# Patient Record
Sex: Male | Born: 2000 | Race: Black or African American | Hispanic: No | Marital: Single | Smoking: Current some day smoker
Health system: Southern US, Community
[De-identification: ages and names within clinical notes are randomized; demographics above are authoritative.]

## PROBLEM LIST (undated history)

## (undated) HISTORY — PX: OTHER SURGICAL HISTORY: SHX169

---
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
## 2019-08-28 IMAGING — CT CT NECK W/ CM
3 of 5 series · 13 of 35 positions shown, 16 images · IV contrast (omnipaque)
Comparison: None.

CLINICAL DATA: Initial evaluation for acute sore throat.

EXAM:
CT NECK WITH CONTRAST
TECHNIQUE: Multidetector CT imaging of the neck was performed using the
standard protocol following the bolus administration of intravenous
contrast.
CONTRAST:  75mL OMNIPAQUE IOHEXOL 300 MG/ML  SOLN

[Series 6: sag neck · sagittal · 0.57mm/px · 5 of 224 slices shown, 6 images]
[im 75/224  bone]
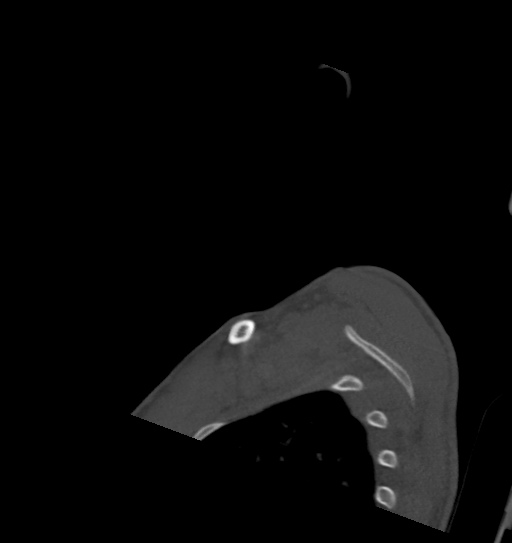
[im 93/224  bone]
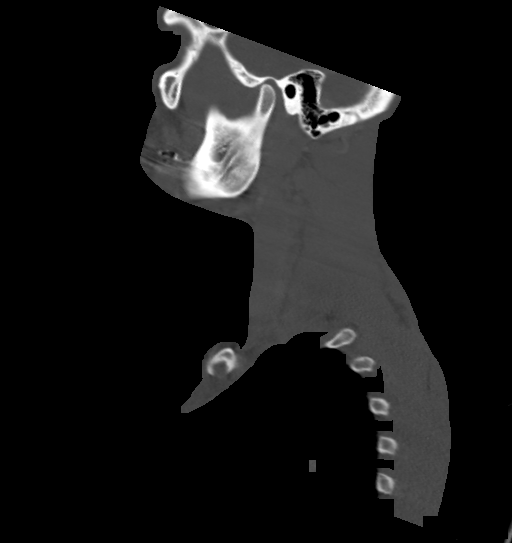
[im 112/224  soft-tissue]
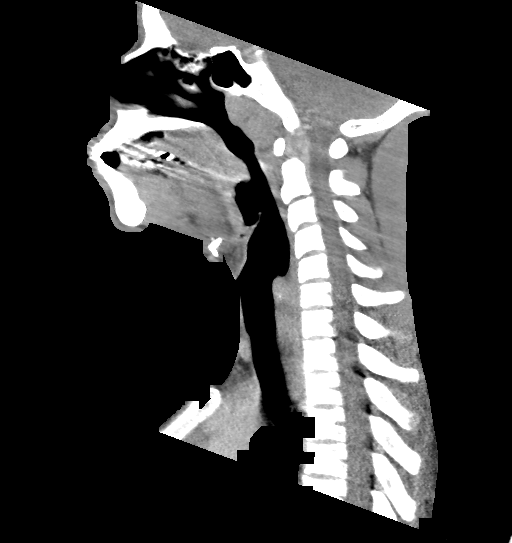
[im 112/224  bone]
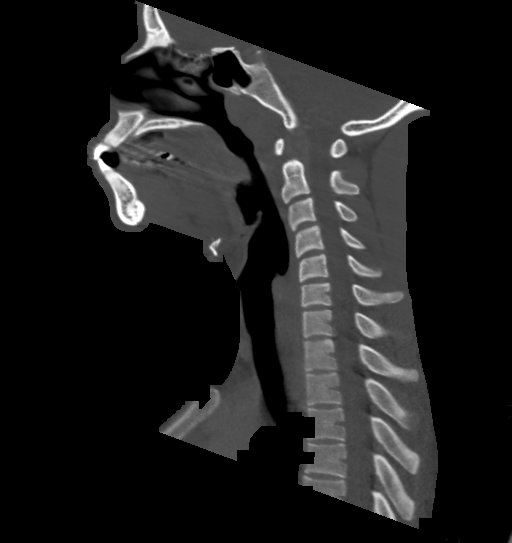
[im 131/224  bone]
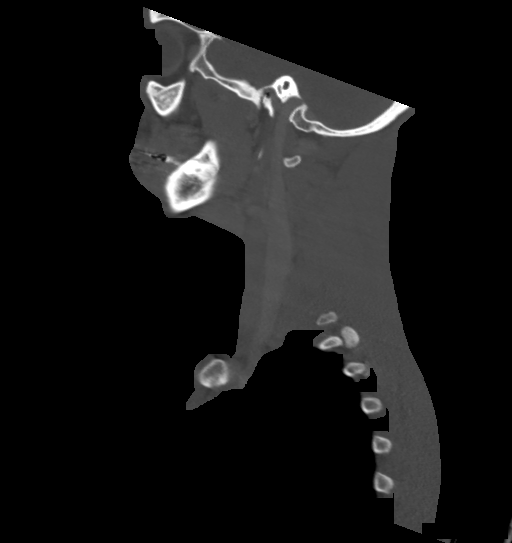
[im 149/224  bone]
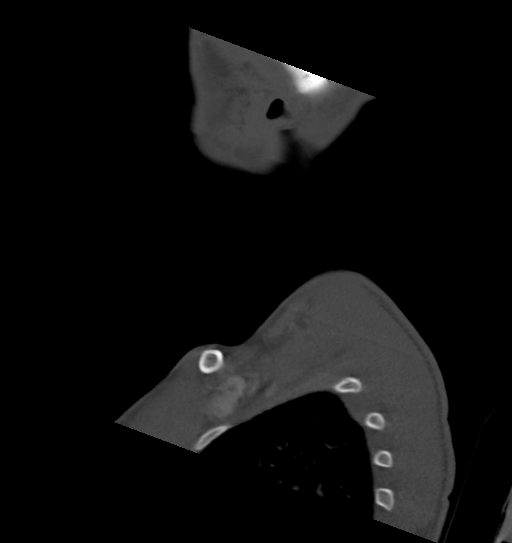

[Series 7: cor neck · coronal · 0.60mm/px · 3 of 118 slices shown]
[im 41/118  bone]
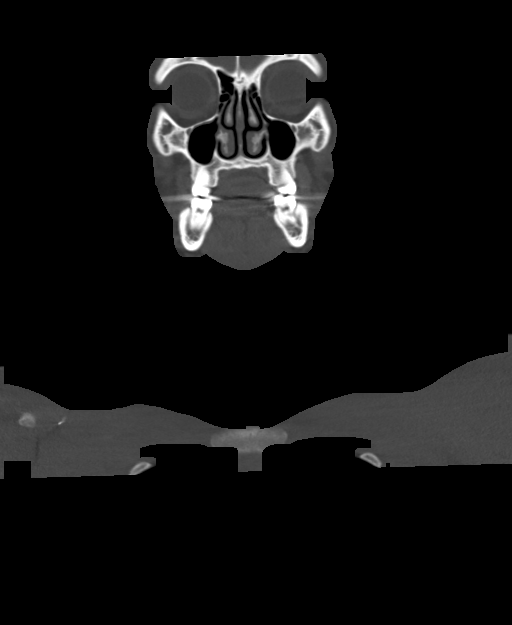
[im 53/118  bone]
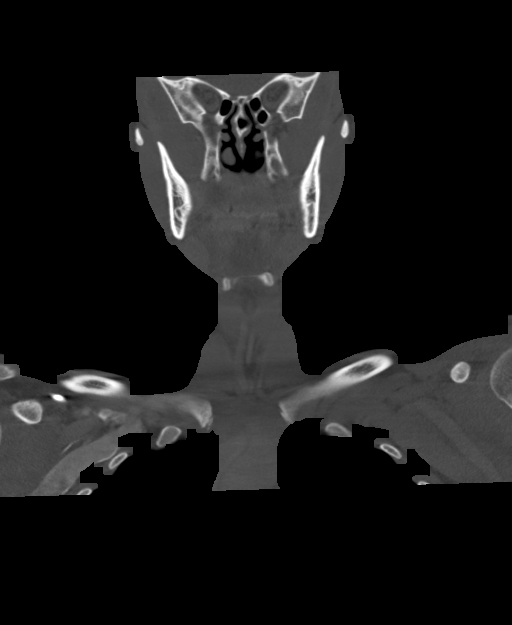
[im 65/118  bone]
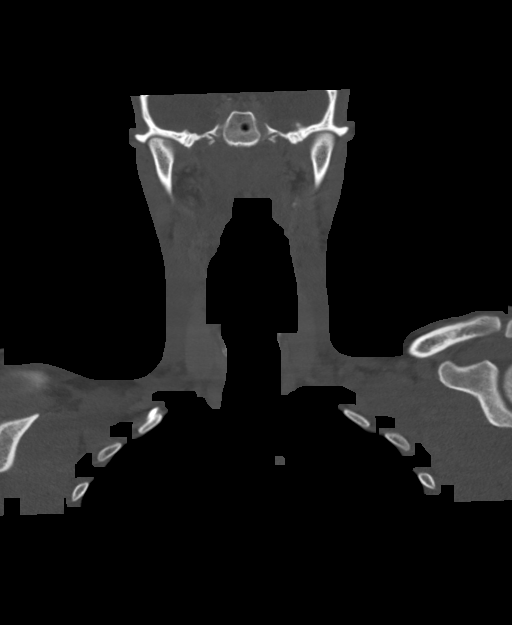

[Series 8: orthogonal ax · axial · 0.51mm/px · z∈[-350,-153]mm · 5 of 162 slices shown, 7 images]
[im 27/162  soft-tissue]
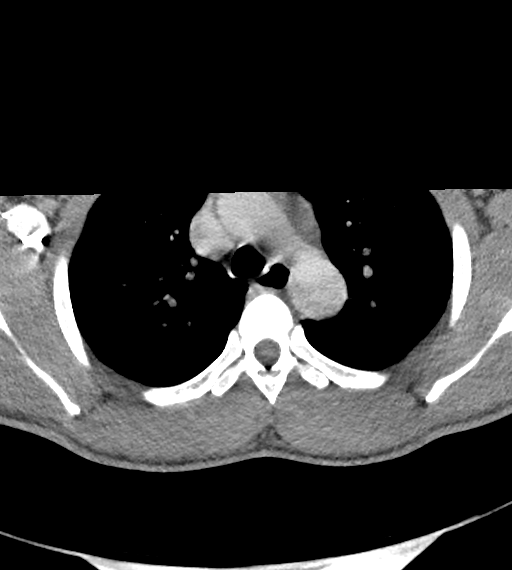
[im 27/162  bone]
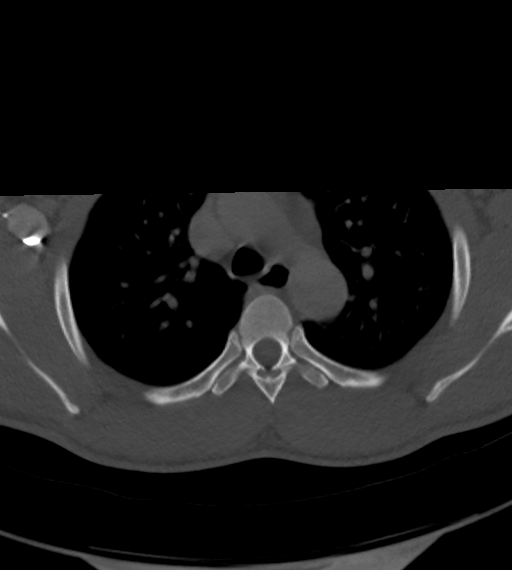
[im 54/162  bone]
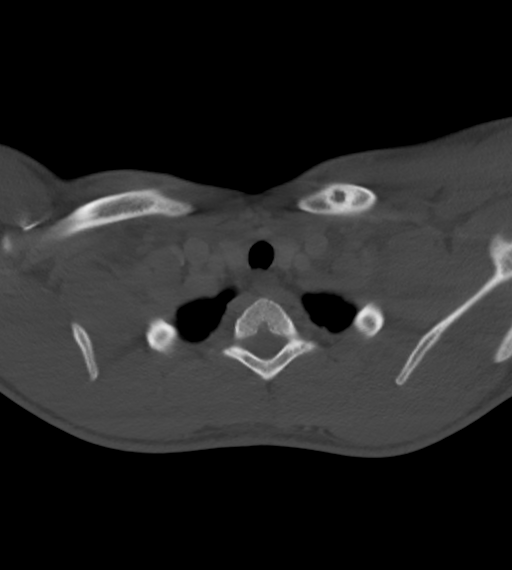
[im 81/162  bone]
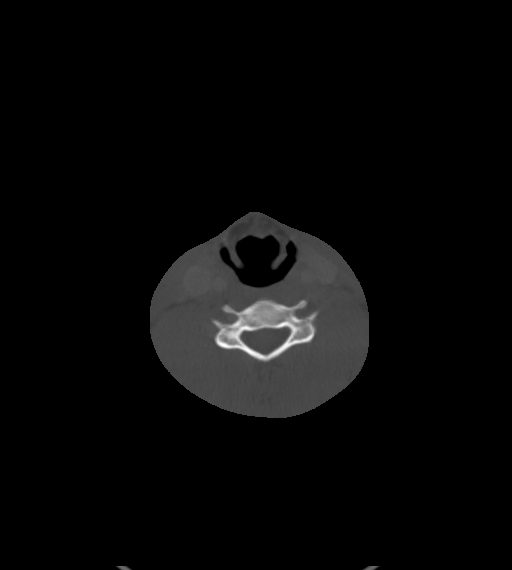
[im 108/162  bone]
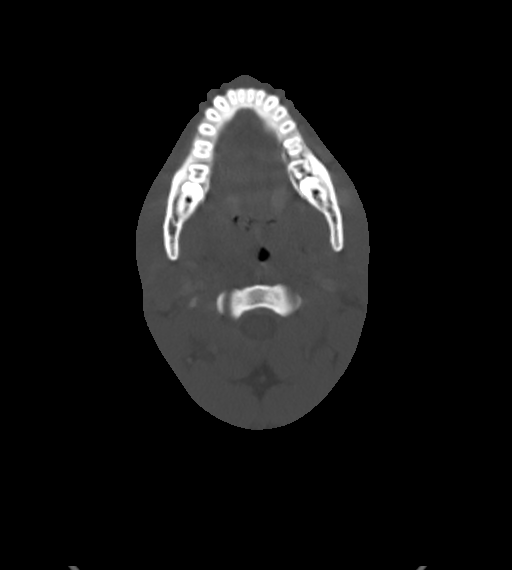
[im 135/162  soft-tissue]
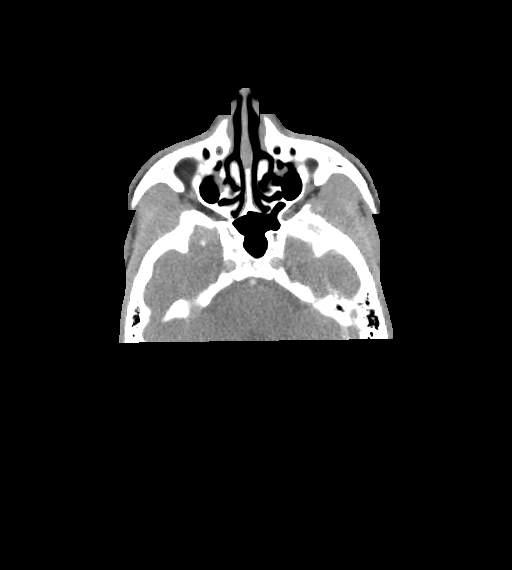
[im 135/162  bone]
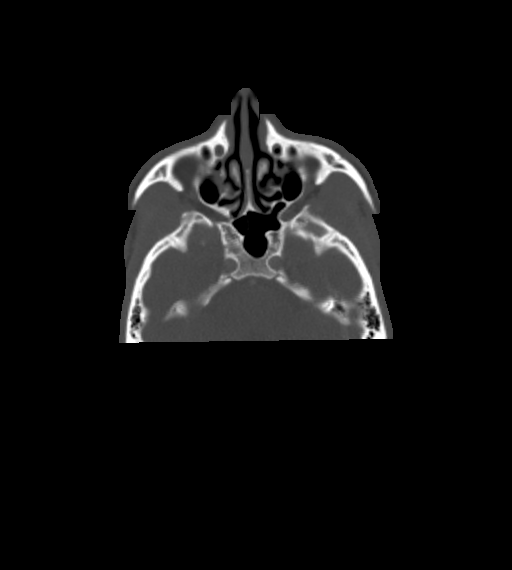

[13 of 35 positions shown; findings below may reference images not displayed]

FINDINGS: Pharynx and larynx: Oral cavity grossly within normal limits,
although evaluation limited by streak artifact from dental amalgam.
No acute inflammatory changes about the dentition. Palatine tonsils
enlarged and hyperenhancing bilaterally, suggesting acute
tonsillitis. Asymmetric enlargement of the right tonsil as compared
to the left with superimposed 6 x 10 x 10 mm tonsillar/peritonsillar
abscess (series 2, image 38). Right tonsil somewhat medialized
towards the midline, abutting the uvula. Mild adjacent inflammatory
stranding within the right parapharyngeal fat. Adenoidal soft
tissues prominent and hypertrophied as well. Epiglottis normal. No
retropharyngeal collection. Mild mucosal edema within the right
oropharynx, consistent with associated pharyngitis. Remainder of the
hypopharynx and supraglottic larynx within normal limits. Glottis
normal. Subglottic airway clear.

Salivary glands: Salivary glands including the parotid and
submandibular glands within normal limits.

Thyroid: Unremarkable.

Lymph nodes: Mildly prominent right level II lymph nodes, likely
reactive. No pathologically enlarged adenopathy within the neck.

Vascular: Normal intravascular enhancement seen throughout the neck.

Limited intracranial: Unremarkable.

Visualized orbits: Unremarkable.

Mastoids and visualized paranasal sinuses: Visualized paranasal
sinuses are clear. Mastoid air cells and middle ear cavities are
well pneumatized and free of fluid.

Skeleton: No acute osseous abnormality. No discrete lytic or blastic
osseous lesions.

Upper chest: Visualized upper chest demonstrates no acute finding.

Other: None.
IMPRESSION: Findings compatible with acute tonsillitis/pharyngitis, primarily
involving the right palatine tonsil and right pharynx. Superimposed
6 x 10 x 10 mm right tonsillar/peritonsillar abscess as above.

## 2020-05-24 ENCOUNTER — Ambulatory Visit: Payer: Medicaid Other

## 2020-05-25 ENCOUNTER — Encounter: Payer: Self-pay | Admitting: Physician Assistant

## 2020-05-25 ENCOUNTER — Ambulatory Visit: Payer: Medicaid Other | Admitting: Physician Assistant

## 2020-05-25 ENCOUNTER — Other Ambulatory Visit: Payer: Self-pay

## 2020-05-25 DIAGNOSIS — Z202 Contact with and (suspected) exposure to infections with a predominantly sexual mode of transmission: Secondary | ICD-10-CM

## 2020-05-25 DIAGNOSIS — J45909 Unspecified asthma, uncomplicated: Secondary | ICD-10-CM | POA: Diagnosis not present

## 2020-05-25 DIAGNOSIS — Z113 Encounter for screening for infections with a predominantly sexual mode of transmission: Secondary | ICD-10-CM | POA: Diagnosis not present

## 2020-05-25 MED ORDER — DOXYCYCLINE HYCLATE 100 MG PO TABS: 100.0000 mg | ORAL_TABLET | Freq: Two times a day (BID) | ORAL | 0 refills | Status: AC

## 2020-05-25 NOTE — Progress Notes (Signed)
° °  [facility] Department STI clinic/screening visit  Subjective:  Roberto Hernandez is a 20 y.o. male being seen today for an STI screening visit. The patient reports they do have symptoms.    Patient has the following medical conditions:  There are no problems to display for this patient.    Chief Complaint  Patient presents with   SEXUALLY TRANSMITTED DISEASE    screening    HPI  Patient reports that he has had a slight discharge for 1 week and is a contact to Chlamydia.  Denies any other symptoms, chronic conditions and regular medicines.  States that he had a HIV test last year.  Reports that he has a time constraint and wants treatment only today.   See flowsheet for further details and programmatic requirements.    The following portions of the patient's history were reviewed and updated as appropriate: allergies, current medications, past medical history, past social history, past surgical history and problem list.  Objective:  There were no vitals filed for this visit.  Physical Exam Constitutional:      General: He is not in acute distress.    Appearance: Normal appearance.  HENT:     Head: Normocephalic and atraumatic.  Eyes:     Conjunctiva/sclera: Conjunctivae normal.  Pulmonary:     Effort: Pulmonary effort is normal.  Skin:    General: Skin is warm and dry.  Neurological:     Mental Status: He is alert and oriented to person, place, and time.  Psychiatric:        Mood and Affect: Mood normal.        Behavior: Behavior normal.        Thought Content: Thought content normal.        Judgment: Judgment normal.       Assessment and Plan:  Roberto Hernandez is a 19 y.o. male presenting to the [facility] Department for STI screening  1. Screening for STD (sexually transmitted disease) Patient into clinic with symptoms. Patient declines screening exam and blood work today.  Requests treatment only today.  Rec condoms with all  sex. RTC prn.  2. Chlamydia contact Will treat as a contact to Chlamydia with Doxycycline 100 mg #14 1 po BID for 7 days. No sex for 14 days and until after partner completes treatment. Call with questions or concerns. - doxycycline (VIBRA-TABS) 100 MG tablet; Take 1 tablet (100 mg total) by mouth 2 (two) times daily for 7 days.  Dispense: 14 tablet; Refill: 0      No follow-ups on file.  Future Appointments  Date Time Provider Department Center  05/25/2020  3:00 PM Matt Holmes, [place] [facility] None    Marylynn Pearson [place], [place]

## 2020-05-27 NOTE — Progress Notes (Signed)
Chart reviewed by Pharmacist  Suzanne Walker PharmD, Contract Pharmacist at [facility]  

## 2020-07-06 ENCOUNTER — Ambulatory Visit: Payer: Medicaid Other | Admitting: Family Medicine

## 2020-07-06 ENCOUNTER — Other Ambulatory Visit: Payer: Self-pay

## 2020-07-06 ENCOUNTER — Encounter: Payer: Self-pay | Admitting: Family Medicine

## 2020-07-06 DIAGNOSIS — Z202 Contact with and (suspected) exposure to infections with a predominantly sexual mode of transmission: Secondary | ICD-10-CM | POA: Diagnosis not present

## 2020-07-06 DIAGNOSIS — Z113 Encounter for screening for infections with a predominantly sexual mode of transmission: Secondary | ICD-10-CM | POA: Diagnosis not present

## 2020-07-06 MED ORDER — CEFTRIAXONE SODIUM 500 MG IJ SOLR
500.0000 mg | Freq: Once | INTRAMUSCULAR | Status: AC
  Administered 2020-07-06: 500 mg via INTRAMUSCULAR

## 2020-07-06 MED ORDER — DOXYCYCLINE HYCLATE 100 MG PO TABS: 100.0000 mg | ORAL_TABLET | Freq: Two times a day (BID) | ORAL | 0 refills | Status: AC

## 2020-07-06 NOTE — Progress Notes (Signed)
   [facility] Department STI clinic/screening visit  Subjective:  Roberto Hernandez is a 20 y.o. male being seen today for an STI screening visit. The patient reports they do have symptoms.    Patient has the following medical conditions:   Patient Active Problem List   Diagnosis Date Noted  . Asthma 05/25/2020     Chief Complaint  Patient presents with  . SEXUALLY TRANSMITTED DISEASE    Screening     HPI  Patient reports contact to gonorrhea   See flowsheet for further details and programmatic requirements.    The following portions of the patient's history were reviewed and updated as appropriate: allergies, current medications, past medical history, past social history, past surgical history and problem list.  Objective:  There were no vitals filed for this visit.  Physical Exam Constitutional:      Appearance: Normal appearance.  Genitourinary:    Comments: Declines Neurological:     Mental Status: He is alert and oriented to person, place, and time.  Psychiatric:        Behavior: Behavior normal.          Assessment and Plan:  Roberto Hernandez is a 20 y.o. male presenting to the [facility] Department for STI screening  1. Screening examination for venereal disease Patient reports contact to gonorrhea, declines exam or screening today. Wants treatment only.  Discussed importance of proper testing for proper treatment.  Patient reports that partner tested positive to [facility].    2. Exposure to gonorrhea - doxycycline (VIBRA-TABS) 100 MG tablet; Take 1 tablet (100 mg total) by mouth 2 (two) times daily for 7 days.  Dispense: 14 tablet; Refill: 0 - cefTRIAXone (ROCEPHIN) injection 500 mg   Patient does have STI symptoms Patient declines  all screenings including  Gram stain, CT/GC and bloodwork for HIV/RPR.  Patient meets criteria for HepB screening? Yes. Ordered? No - declines  Patient meets criteria for HepC screening? Yes.  Ordered? No - declines  Recommended condom use with all sex Discussed importance of condom use for STI prevent Recommended no sex for next 7 days  Recommended returning for continued or worsening symptoms.  Return for as needed.  No future appointments.  Wendi Snipes, FNP

## 2021-07-29 ENCOUNTER — Emergency Department
Admission: EM | Admit: 2021-07-29 | Discharge: 2021-07-29 | Disposition: A | Discharge status: Home / Self Care | Payer: Medicaid Other | Attending: Emergency Medicine | Admitting: Emergency Medicine

## 2021-07-29 ENCOUNTER — Encounter: Payer: Self-pay | Admitting: Emergency Medicine

## 2021-07-29 ENCOUNTER — Other Ambulatory Visit: Payer: Self-pay

## 2021-07-29 DIAGNOSIS — R197 Diarrhea, unspecified: Secondary | ICD-10-CM | POA: Diagnosis not present

## 2021-07-29 DIAGNOSIS — R112 Nausea with vomiting, unspecified: Secondary | ICD-10-CM | POA: Diagnosis not present

## 2021-07-29 DIAGNOSIS — Z20822 Contact with and (suspected) exposure to covid-19: Secondary | ICD-10-CM | POA: Diagnosis not present

## 2021-07-29 LAB — COMPREHENSIVE METABOLIC PANEL
ALT: 22 U/L (ref 0–44)
AST: 29 U/L (ref 15–41)
Albumin: 4.7 g/dL (ref 3.5–5.0)
Alkaline Phosphatase: 54 U/L (ref 38–126)
Anion gap: 14 (ref 5–15)
BUN: 14 mg/dL (ref 6–20)
CO2: 25 mmol/L (ref 22–32)
Calcium: 9.8 mg/dL (ref 8.9–10.3)
Chloride: 100 mmol/L (ref 98–111)
Creatinine, Ser: 1.15 mg/dL (ref 0.61–1.24)
GFR, Estimated: >60 mL/min (ref >60)
Glucose, Bld: 132 mg/dL — ABNORMAL HIGH (ref 70–99)
Potassium: 3.8 mmol/L (ref 3.5–5.1)
Sodium: 139 mmol/L (ref 135–145)
Total Bilirubin: 1.4 mg/dL — ABNORMAL HIGH (ref 0.3–1.2)
Total Protein: 8.6 g/dL — ABNORMAL HIGH (ref 6.5–8.1)

## 2021-07-29 LAB — CBC
HCT: 49.9 % (ref 39.0–52.0)
Hemoglobin: 16.9 g/dL (ref 13.0–17.0)
MCH: 30.7 pg (ref 26.0–34.0)
MCHC: 33.9 g/dL (ref 30.0–36.0)
MCV: 90.7 fL (ref 80.0–100.0)
Platelets: 257 10*3/uL (ref 150–400)
RBC: 5.5 MIL/uL (ref 4.22–5.81)
RDW: 12.9 % (ref 11.5–15.5)
WBC: 7.4 10*3/uL (ref 4.0–10.5)
nRBC: 0 % (ref 0.0–0.2)

## 2021-07-29 LAB — RESP PANEL BY RT-PCR (FLU A&B, COVID) ARPGX2
Influenza A by PCR: NEGATIVE
Influenza B by PCR: NEGATIVE
SARS Coronavirus 2 by RT PCR: NEGATIVE

## 2021-07-29 LAB — LIPASE, BLOOD: Lipase: 26 U/L (ref 11–51)

## 2021-07-29 MED ORDER — ONDANSETRON HCL 4 MG/2ML IJ SOLN: 4.0000 mg | Freq: Once | INTRAMUSCULAR | Status: DC

## 2021-07-29 MED ORDER — LACTATED RINGERS IV BOLUS
1000.0000 mL | Freq: Once | INTRAVENOUS | Status: AC
  Administered 2021-07-29: 1000 mL via INTRAVENOUS

## 2021-07-29 MED ORDER — ONDANSETRON HCL 4 MG/2ML IJ SOLN
4.0000 mg | Freq: Once | INTRAMUSCULAR | Status: DC | PRN
  Filled 2021-07-29: qty 2

## 2021-07-29 MED ORDER — DROPERIDOL 2.5 MG/ML IJ SOLN
2.5000 mg | Freq: Once | INTRAMUSCULAR | Status: AC
  Administered 2021-07-29: 2.5 mg via INTRAVENOUS
  Filled 2021-07-29: qty 2

## 2021-07-29 MED ORDER — ONDANSETRON 4 MG PO TBDP: 4.0000 mg | ORAL_TABLET | Freq: Three times a day (TID) | ORAL | 0 refills | Status: AC | PRN

## 2021-07-29 NOTE — ED Provider Notes (Signed)
? ?[facility] ?Provider Note ? ? ? Event Date/Time  ? First MD Initiated Contact with Patient 07/29/21 [phone]   ?  (approximate) ? ? ?History  ? ?Emesis ? ? ?HPI ? ?Roberto Hernandez is a 21 y.o. male without significant past medical history presents accompanied by a friend for assessment of nonbloody nonbilious nausea and vomiting as well as some nonbloody diarrhea, headache and some back discomfort poorly localized by patient started yesterday.  He denies any earache, sore throat, vision changes, fevers, cough, chest pain, shortness of breath abdominal pain or urinary symptoms.  No recent falls or injuries.  No rash or extremity pain.  No urinary symptoms.  Denies EtOH use illicit drugs or tobacco abuse.  No clear alleviating or rating factors.  No prior similar episodes. ? ?  ? ? ?Physical Exam  ?Triage Vital Signs: ?ED Triage Vitals  ?Enc Vitals Group  ?   BP   ?   Pulse   ?   Resp   ?   Temp   ?   Temp src   ?   SpO2   ?   Weight   ?   Height   ?   Head Circumference   ?   Peak Flow   ?   Pain Score   ?   Pain Loc   ?   Pain Edu?   ?   Excl. in [place]?   ? ? ?Most recent vital signs: ?Vitals:  ? 07/29/21 0700  ?BP: 138/87  ?Pulse: 71  ?Resp: (!) 22  ?Temp: 98 ?F (36.7 ?C)  ?SpO2: 100%  ? ? ?General: Awake, appears uncomfortable shivering and dry heaving on exam. ?CV:  2+ radial pulses.  Slightly prolonged capillary fill in the digits.  No murmur. ?Resp:  Normal effort.  Clear bilaterally. ?Abd:  No distention.  Soft. ?Other:  Dry mucous membranes. ? ? ?ED Results / Procedures / Treatments  ?Labs ?(all labs ordered are listed, but only abnormal results are displayed) ?Labs Reviewed  ?COMPREHENSIVE METABOLIC PANEL - Abnormal; Notable for the following components:  ?    Result Value  ? Glucose, Bld 132 (*)   ? Total Protein 8.6 (*)   ? Total Bilirubin 1.4 (*)   ? All other components within normal limits  ?RESP PANEL BY RT-PCR (FLU A&B, COVID) ARPGX2  ?LIPASE, BLOOD  ?CBC  ?URINALYSIS, ROUTINE W  REFLEX MICROSCOPIC  ? ? ? ?EKG ? ?ECG is remarkable for sinus rhythm with a ventricular rate of 70, unremarkable intervals without evidence of clear acute ischemia or significant arrhythmia. ? ? ?RADIOLOGY ? ? ?PROCEDURES: ? ?Critical Care performed: No ? ?Procedures ? ? ?MEDICATIONS ORDERED IN ED: ?Medications  ?ondansetron (ZOFRAN) injection 4 mg (has no administration in time range)  ?lactated ringers bolus 1,000 mL (1,000 mLs Intravenous New Bag/Given 07/29/21 0737)  ?droperidol (INAPSINE) 2.5 MG/ML injection 2.5 mg (2.5 mg Intravenous Given 07/29/21 0735)  ? ? ? ?IMPRESSION / MDM / ASSESSMENT AND PLAN / ED COURSE  ?I reviewed the triage vital signs and the nursing notes. ?             ?               ? ?Differential diagnosis includes, but is not limited to acute infectious gastroenteritis, metabolic derangement including electrolyte derangements, kidney injury, DKA as well as possible pancreatitis.  Lower suspicion at this time for ischemic or autoimmune colitis.  Patient has no history of  these.  ECG is not just above ischemia or arrhythmia. ? ?CBC without leukocytosis or acute anemia.  CMP shows no significant electrolyte or metabolic derangements.  No evidence of acute hepatitis.  Alk phos is normal and T. bili is 1.4 and overall this is not suggestive of significant acute cholestatic process.  In addition there is no focal right upper quadrant tenderness to suggest cholecystitis or cholangitis.  Lipase not consistent with acute pancreatitis. ? ?On reassessment patient states his headache and nausea is much better.  He is able to tolerate p.o.  I considered further observation given stable vitals with reassuring exam and work-up able tolerate p.o. I think he is stable for discharge with outpatient follow-up.  I suspect likely an acute infectious gastroenteritis.  Discussed importance of outpatient PCP follow-up.  Rx written for Zofran.  Discussed returning for any new or worsening of symptoms.  Discharged in  stable condition.  Strict return precautions advised and discussed. ? ?  ? ? ?FINAL CLINICAL IMPRESSION(S) / ED DIAGNOSES  ? ?Final diagnoses:  ?Nausea vomiting and diarrhea  ? ? ? ?Rx / DC Orders  ? ?ED Discharge Orders   ? ?      Ordered  ?  ondansetron (ZOFRAN-ODT) 4 MG disintegrating tablet  Every 8 hours PRN       ? 07/29/21 0907  ? ?  ?  ? ?  ? ? ? ?Note:  This document was prepared using Dragon voice recognition software and may include unintentional dictation errors. ?  ?Lucrezia Starch, MD ?07/29/21 [phone] ? ?

## 2021-07-29 NOTE — ED Notes (Addendum)
Upon walking in room, patient handed this RN his IV that was pulled out of his arm. Patient ambulating around room, unhooked all of monitor leads ?

## 2021-07-29 NOTE — ED Triage Notes (Addendum)
Pt arrived via POV reports vomiting since last night. Dry heaving with minimal emesis in lobby. ?

## 2021-08-04 ENCOUNTER — Emergency Department (HOSPITAL_COMMUNITY): Payer: Medicaid Other

## 2021-08-04 ENCOUNTER — Emergency Department (HOSPITAL_COMMUNITY)
Admission: EM | Admit: 2021-08-04 | Discharge: 2021-08-04 | Disposition: A | Discharge status: Home / Self Care | Payer: Medicaid Other | Attending: Emergency Medicine | Admitting: Emergency Medicine

## 2021-08-04 DIAGNOSIS — R000 Tachycardia, unspecified: Secondary | ICD-10-CM | POA: Insufficient documentation

## 2021-08-04 DIAGNOSIS — S0990XA Unspecified injury of head, initial encounter: Secondary | ICD-10-CM | POA: Diagnosis present

## 2021-08-04 DIAGNOSIS — F10920 Alcohol use, unspecified with intoxication, uncomplicated: Secondary | ICD-10-CM | POA: Insufficient documentation

## 2021-08-04 DIAGNOSIS — S0181XA Laceration without foreign body of other part of head, initial encounter: Secondary | ICD-10-CM | POA: Insufficient documentation

## 2021-08-04 DIAGNOSIS — F1290 Cannabis use, unspecified, uncomplicated: Secondary | ICD-10-CM | POA: Diagnosis not present

## 2021-08-04 LAB — CBC WITH DIFFERENTIAL/PLATELET
Abs Immature Granulocytes: 0.01 10*3/uL (ref 0.00–0.07)
Basophils Absolute: 0 10*3/uL (ref 0.0–0.1)
Basophils Relative: 0 %
Eosinophils Absolute: 0.1 10*3/uL (ref 0.0–0.5)
Eosinophils Relative: 1 %
HCT: 48.2 % (ref 39.0–52.0)
Hemoglobin: 16 g/dL (ref 13.0–17.0)
Immature Granulocytes: 0 %
Lymphocytes Relative: 45 %
Lymphs Abs: 3.8 10*3/uL (ref 0.7–4.0)
MCH: 30.7 pg (ref 26.0–34.0)
MCHC: 33.2 g/dL (ref 30.0–36.0)
MCV: 92.5 fL (ref 80.0–100.0)
Monocytes Absolute: 0.6 10*3/uL (ref 0.1–1.0)
Monocytes Relative: 7 %
Neutro Abs: 4.1 10*3/uL (ref 1.7–7.7)
Neutrophils Relative %: 47 %
Platelets: 276 10*3/uL (ref 150–400)
RBC: 5.21 MIL/uL (ref 4.22–5.81)
RDW: 12.9 % (ref 11.5–15.5)
WBC: 8.6 10*3/uL (ref 4.0–10.5)
nRBC: 0 % (ref 0.0–0.2)

## 2021-08-04 LAB — COMPREHENSIVE METABOLIC PANEL
ALT: 21 U/L (ref 0–44)
AST: 32 U/L (ref 15–41)
Albumin: 4.4 g/dL (ref 3.5–5.0)
Alkaline Phosphatase: 50 U/L (ref 38–126)
Anion gap: 15 (ref 5–15)
BUN: 8 mg/dL (ref 6–20)
CO2: 20 mmol/L — ABNORMAL LOW (ref 22–32)
Calcium: 9 mg/dL (ref 8.9–10.3)
Chloride: 109 mmol/L (ref 98–111)
Creatinine, Ser: 1.27 mg/dL — ABNORMAL HIGH (ref 0.61–1.24)
GFR, Estimated: >60 mL/min (ref >60)
Glucose, Bld: 109 mg/dL — ABNORMAL HIGH (ref 70–99)
Potassium: 3.2 mmol/L — ABNORMAL LOW (ref 3.5–5.1)
Sodium: 144 mmol/L (ref 135–145)
Total Bilirubin: 0.6 mg/dL (ref 0.3–1.2)
Total Protein: 7.5 g/dL (ref 6.5–8.1)

## 2021-08-04 LAB — ETHANOL: Alcohol, Ethyl (B): 233 mg/dL — ABNORMAL HIGH (ref <10)

## 2021-08-04 MED ORDER — LIDOCAINE-EPINEPHRINE (PF) 2 %-1:200000 IJ SOLN
20.0000 mL | Freq: Once | INTRAMUSCULAR | Status: AC
  Administered 2021-08-04: 20 mL
  Filled 2021-08-04: qty 20

## 2021-08-04 NOTE — Progress Notes (Signed)
Orthopedic Tech Progress Note ?Patient Details:  ?Roberto Hernandez ?12/11/2000 ?[id number] ? ?Patient ID: Roberto Hernandez, male   DOB: [birth year], 21 y.o.   MRN: [id number] ?I attended trauma page. ?Trinna Post ?08/04/2021, 6:56 AM ? ?
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
## 2021-08-04 NOTE — ED Notes (Signed)
[name] [phone] would like to speak to his brother and get an update ?
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
## 2022-07-14 ENCOUNTER — Ambulatory Visit: Payer: Medicaid Other | Admitting: Family Medicine
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
## 2023-01-22 ENCOUNTER — Ambulatory Visit: Payer: Medicaid Other

## 2023-05-20 ENCOUNTER — Emergency Department
Admission: EM | Admit: 2023-05-20 | Discharge: 2023-05-20 | Disposition: A | Discharge status: Home / Self Care | Payer: Medicaid Other

## 2023-05-20 NOTE — ED Notes (Signed)
Called x 3 for triage 

## 2023-05-20 NOTE — ED Notes (Signed)
Called x 1 for triage 

## 2023-05-20 NOTE — ED Notes (Signed)
Called x 2 for triage. 

## 2023-08-17 ENCOUNTER — Other Ambulatory Visit: Payer: Self-pay

## 2023-08-17 ENCOUNTER — Emergency Department (HOSPITAL_COMMUNITY)
Admission: EM | Admit: 2023-08-17 | Discharge: 2023-08-18 | Discharge status: Against Medical Advice | Attending: Emergency Medicine | Admitting: Emergency Medicine

## 2023-08-17 ENCOUNTER — Encounter (HOSPITAL_COMMUNITY): Payer: Self-pay

## 2023-08-17 ENCOUNTER — Emergency Department (HOSPITAL_COMMUNITY)

## 2023-08-17 DIAGNOSIS — R109 Unspecified abdominal pain: Secondary | ICD-10-CM | POA: Insufficient documentation

## 2023-08-17 DIAGNOSIS — R0789 Other chest pain: Secondary | ICD-10-CM | POA: Insufficient documentation

## 2023-08-17 DIAGNOSIS — M25562 Pain in left knee: Secondary | ICD-10-CM | POA: Diagnosis not present

## 2023-08-17 DIAGNOSIS — M542 Cervicalgia: Secondary | ICD-10-CM | POA: Diagnosis not present

## 2023-08-17 DIAGNOSIS — M25561 Pain in right knee: Secondary | ICD-10-CM | POA: Diagnosis not present

## 2023-08-17 DIAGNOSIS — Y92410 Unspecified street and highway as the place of occurrence of the external cause: Secondary | ICD-10-CM | POA: Insufficient documentation

## 2023-08-17 DIAGNOSIS — Z5321 Procedure and treatment not carried out due to patient leaving prior to being seen by health care provider: Secondary | ICD-10-CM | POA: Insufficient documentation

## 2023-08-17 DIAGNOSIS — S0990XA Unspecified injury of head, initial encounter: Secondary | ICD-10-CM | POA: Insufficient documentation

## 2023-08-17 LAB — CBC
HCT: 47.6 % (ref 39.0–52.0)
Hemoglobin: 15.9 g/dL (ref 13.0–17.0)
MCH: 30.8 pg (ref 26.0–34.0)
MCHC: 33.4 g/dL (ref 30.0–36.0)
MCV: 92.1 fL (ref 80.0–100.0)
Platelets: 241 10*3/uL (ref 150–400)
RBC: 5.17 MIL/uL (ref 4.22–5.81)
RDW: 13.6 % (ref 11.5–15.5)
WBC: 5.3 10*3/uL (ref 4.0–10.5)
nRBC: 0 % (ref 0.0–0.2)

## 2023-08-17 LAB — BASIC METABOLIC PANEL WITH GFR
Anion gap: 11 (ref 5–15)
BUN: 10 mg/dL (ref 6–20)
CO2: 26 mmol/L (ref 22–32)
Calcium: 9.6 mg/dL (ref 8.9–10.3)
Chloride: 103 mmol/L (ref 98–111)
Creatinine, Ser: 1.1 mg/dL (ref 0.61–1.24)
GFR, Estimated: >60 mL/min (ref >60)
Glucose, Bld: 79 mg/dL (ref 70–99)
Potassium: 4.2 mmol/L (ref 3.5–5.1)
Sodium: 140 mmol/L (ref 135–145)

## 2023-08-17 MED ORDER — HYDROCODONE-ACETAMINOPHEN 5-325 MG PO TABS
1.0000 | ORAL_TABLET | Freq: Once | ORAL | Status: AC
  Administered 2023-08-17: 1 via ORAL
  Filled 2023-08-17: qty 1
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
## 2023-08-18 ENCOUNTER — Emergency Department
Admission: EM | Admit: 2023-08-18 | Discharge: 2023-08-18 | Disposition: A | Discharge status: Home / Self Care | Attending: Emergency Medicine | Admitting: Emergency Medicine

## 2023-08-18 ENCOUNTER — Emergency Department

## 2023-08-18 DIAGNOSIS — R0781 Pleurodynia: Secondary | ICD-10-CM | POA: Insufficient documentation

## 2023-08-18 DIAGNOSIS — M79605 Pain in left leg: Secondary | ICD-10-CM | POA: Diagnosis present

## 2023-08-18 DIAGNOSIS — J45909 Unspecified asthma, uncomplicated: Secondary | ICD-10-CM | POA: Diagnosis not present

## 2023-08-18 MED ORDER — HYDROCODONE-ACETAMINOPHEN 5-325 MG PO TABS
1.0000 | ORAL_TABLET | Freq: Once | ORAL | Status: AC
  Administered 2023-08-18: 1 via ORAL
  Filled 2023-08-18: qty 1

## 2023-08-18 NOTE — Discharge Instructions (Addendum)
 The x-rays of your ribs and left leg did not show any fractures today.  You can take 650 mg of Tylenol and 600 mg of ibuprofen every 6 hours as needed for pain. You can use ice, heat, muscle creams and other topical pain relievers as well.  You will likely be more sore tomorrow that you are today, this is normal. After this your pain should slowly be improving. If not, you need to be seen by another health care provider. This could be your PCP, urgent care or in the emergency department. Return to the emergency department specifically, if you have development of chest pain, shortness of breath, abdominal pain, new abdominal bruising or any other symptom personally concerning to you.
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
## 2023-08-18 NOTE — ED Notes (Signed)
 ED Provider at bedside.

## 2023-08-18 NOTE — ED Notes (Signed)
 No resp for vitals after 20+ mins - multiple attempts
